# Patient Record
Sex: Female | Born: 1995 | Race: Black or African American | Hispanic: No | Marital: Single | Smoking: Never smoker
Health system: Southern US, Community
[De-identification: ages and names within clinical notes are randomized; demographics above are authoritative.]

---
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
## 2017-09-01 ENCOUNTER — Other Ambulatory Visit: Payer: Self-pay

## 2017-09-01 ENCOUNTER — Emergency Department
Admission: EM | Admit: 2017-09-01 | Discharge: 2017-09-01 | Disposition: A | Discharge status: Home / Self Care | Payer: Self-pay | Attending: Emergency Medicine | Admitting: Emergency Medicine

## 2017-09-01 ENCOUNTER — Encounter: Payer: Self-pay | Admitting: Emergency Medicine

## 2017-09-01 DIAGNOSIS — Z79899 Other long term (current) drug therapy: Secondary | ICD-10-CM | POA: Insufficient documentation

## 2017-09-01 DIAGNOSIS — J020 Streptococcal pharyngitis: Secondary | ICD-10-CM | POA: Insufficient documentation

## 2017-09-01 LAB — GROUP A STREP BY PCR: GROUP A STREP BY PCR: NOT DETECTED

## 2017-09-01 MED ORDER — ACETAMINOPHEN-CODEINE #3 300-30 MG PO TABS: 1.0000 | ORAL_TABLET | Freq: Four times a day (QID) | ORAL | 0 refills | Status: AC | PRN

## 2017-09-01 MED ORDER — DEXAMETHASONE SODIUM PHOSPHATE 10 MG/ML IJ SOLN
10.0000 mg | Freq: Once | INTRAMUSCULAR | Status: AC
  Administered 2017-09-01: 10 mg via INTRAMUSCULAR
  Filled 2017-09-01: qty 1

## 2017-09-01 MED ORDER — PENICILLIN G BENZATHINE 1200000 UNIT/2ML IM SUSP
1.2000 10*6.[IU] | Freq: Once | INTRAMUSCULAR | Status: AC
  Administered 2017-09-01: 1.2 10*6.[IU] via INTRAMUSCULAR
  Filled 2017-09-01: qty 2

## 2017-09-01 MED ORDER — ACETAMINOPHEN 160 MG/5ML PO SOLN
325.0000 mg | Freq: Once | ORAL | Status: AC
  Administered 2017-09-01: 325 mg via ORAL

## 2017-09-01 MED ORDER — HYDROCODONE-ACETAMINOPHEN 7.5-325 MG/15ML PO SOLN
10.0000 mL | Freq: Once | ORAL | Status: AC
  Administered 2017-09-01: 10 mL via ORAL
  Filled 2017-09-01: qty 15

## 2017-09-01 MED ORDER — PREDNISONE 10 MG (21) PO TBPK: ORAL_TABLET | ORAL | 0 refills | Status: DC

## 2017-09-01 MED ORDER — LIDOCAINE VISCOUS 2 % MT SOLN
15.0000 mL | Freq: Once | OROMUCOSAL | Status: AC
Start: 2017-09-01 — End: 2017-09-01
  Administered 2017-09-01: 15 mL via OROMUCOSAL
  Filled 2017-09-01: qty 15

## 2017-09-01 NOTE — ED Notes (Signed)
See triage note  Presents with a 3-5 day hx of sore throat   Unsure of fever at home but febrile on arrival  Increased pain with swallowing

## 2017-09-01 NOTE — ED Triage Notes (Signed)
Arrives c/o sore throat x 3 days.

## 2017-09-01 NOTE — Discharge Instructions (Signed)
You have been treated for strep throat (pharyngitis) based on your clinical exam, despite a negative rapid strep test. Take the pain medicine as needed and the steroid as directed. Change your toothbrush head in 24-hours. Rinse with warm-salty water and drink plenty of fluids (Powerade/Gatorade, etc.) to prevent dehydration. Follow-up with ENT for further management. See [facility] for routine medical care.

## 2017-09-01 NOTE — ED Provider Notes (Signed)
[facility] Emergency Department Provider Note ____________________________________________  Time seen: [id number]  I have reviewed the triage vital signs and the nursing notes.  HISTORY  Chief Complaint  Sore Throat  HPI Laura Yoder is a 22 y.o. female presents to the ED accompanied by family member for evaluation of sore throat for the last 3 days.  Patient describes intermittent sore throat pain for the last week, but sharply worsening pain, swelling, and discomfort with swallowing, over the last 3 days.  Patient herself is unaware of any fevers, but presents to us febrile and tachycardic in triage.  She does note a past medical history of tachycardia.  She denies any nausea, vomiting, rash, or shortness of breath.  She has had decreased oral intake secondary to pain and discomfort.  She does note she was able to drink a smoothie earlier today.  She denies any sick contacts or other exposures.  She does report 2 previous strep infection in the past with her most recent infection being in September of last year.  History reviewed. No pertinent past medical history.  There are no active problems to display for this patient.  History reviewed. No pertinent surgical history.  Prior to Admission medications   Medication Sig Start Date End Date Taking? Authorizing Provider  acetaminophen-codeine (TYLENOL #3) 300-30 MG tablet Take 1 tablet by mouth every 6 (six) hours as needed for moderate pain. 09/01/17   Skylynne Schlechter, Charlesetta IvoryJenise V Bacon, PA-C  desoximetasone (TOPICORT) 0.25 % cream Apply 1 application topically 2 (two) times daily. 04/29/16   Joni ReiningSmith, Ronald K, PA-C  methylPREDNISolone (MEDROL DOSEPAK) 4 MG TBPK tablet Take Tapered dose as directed 04/29/16   Joni ReiningSmith, Ronald K, PA-C  predniSONE (STERAPRED UNI-PAK 21 TAB) 10 MG (21) TBPK tablet 6-day taper as directed. 09/01/17   Towana Stenglein, Charlesetta IvoryJenise V Bacon, PA-C    Allergies Patient has no known allergies.  No family history on  file.  Social History Social History   Tobacco Use  . Smoking status: Never Smoker  . Smokeless tobacco: Never Used  Substance Use Topics  . Alcohol use: No  . Drug use: No    Review of Systems  Constitutional: Negative for fever. Eyes: Negative for visual changes. ENT: Positive for sore throat. Cardiovascular: Negative for chest pain. Respiratory: Negative for shortness of breath. Gastrointestinal: Negative for abdominal pain, vomiting and diarrhea. Skin: Negative for rash. Neurological: Negative for headaches, focal weakness or numbness. ____________________________________________  PHYSICAL EXAM:  VITAL SIGNS: ED Triage Vitals  Enc Vitals Group     BP 09/01/17 1740 (!) 125/91     Pulse Rate 09/01/17 1740 (!) 120     Resp 09/01/17 1740 18     Temp 09/01/17 1740 99.7 F (37.6 C)     Temp Source 09/01/17 1740 Oral     SpO2 09/01/17 1740 99 %     Weight 09/01/17 1718 135 lb (61.2 kg)     Height 09/01/17 1718 5\' 6"  (1.676 m)     Head Circumference --      Peak Flow --      Pain Score 09/01/17 1717 8     Pain Loc --      Pain Edu? --      Excl. in GC? --     Constitutional: Alert and oriented. Well appearing and in no distress. Head: Normocephalic and atraumatic. Eyes: Conjunctivae are normal. PERRL. Normal extraocular movements Ears: Canals clear. TMs intact bilaterally. Mouth/Throat: Mucous membranes are moist. Uvula is midline and tonsils  are enlarged and with exudates R>L. Pain elicited with attempts to open the jaw.  Neck: Supple. No thyromegaly. Hematological/Lymphatic/Immunological: Palpable right anterior cervical lymphadenopathy. Cardiovascular: Normal rate, regular rhythm. Normal distal pulses. Respiratory: Normal respiratory effort. No wheezes/rales/rhonchi. Musculoskeletal: Nontender with normal range of motion in all extremities.  Neurologic:  Normal gait without ataxia. Normal speech and language. No gross focal neurologic deficits are  appreciated. ____________________________________________   LABS (pertinent positives/negatives)  Labs Reviewed  GROUP A STREP BY PCR  ____________________________________________  PROCEDURES  Procedures Viscous Lidocaine 2% - 15 ml PO Decadron 10 mg IM Bicillin LA 1.2 million units IM Hydrocodone-acetaminophen 7.5-325 mg/15 ml - 10 ml PO ____________________________________________  INITIAL IMPRESSION / ASSESSMENT AND PLAN / ED COURSE  Patient presents to the ED with worsening sore throat pain over the last 3 days.  Patient's exam reveals enlarged tonsils bilaterally.  It is my suspicion that the nurse was unable to obtain a sufficient specimen for the throat culture which limits its reliability.  As such, the patient is been treated empirically for an acute strep tonsillitis.  She reports improvement of her symptoms following medication administration in the ED.  Post medication administration, her exam also reveals her ability to smoothly open the jaw and does reveal 2+ tonsils with exudates bilaterally.  Uvula remains midline.  Patient is able to control secretions and has better phonation at this time.  She will be discharged with a prescription for Tylenol #3, as well as a prednisone taper pack.  Return precautions have been reviewed with the patient and her family member.  She will further be referred to ENT for evaluation management as needed.  Work note is provided for tomorrow as requested. ____________________________________________  FINAL CLINICAL IMPRESSION(S) / ED DIAGNOSES  Final diagnoses:  Acute streptococcal pharyngitis      Karmen Stabs, Charlesetta Ivory, PA-C 09/01/17 1943    Arnaldo Natal, MD 09/01/17 2336

## 2018-01-03 DIAGNOSIS — S99911A Unspecified injury of right ankle, initial encounter: Secondary | ICD-10-CM | POA: Diagnosis present

## 2018-01-03 DIAGNOSIS — Y998 Other external cause status: Secondary | ICD-10-CM | POA: Diagnosis not present

## 2018-01-03 DIAGNOSIS — Y9367 Activity, basketball: Secondary | ICD-10-CM | POA: Diagnosis not present

## 2018-01-03 DIAGNOSIS — Y929 Unspecified place or not applicable: Secondary | ICD-10-CM | POA: Insufficient documentation

## 2018-01-03 DIAGNOSIS — W010XXA Fall on same level from slipping, tripping and stumbling without subsequent striking against object, initial encounter: Secondary | ICD-10-CM | POA: Insufficient documentation

## 2018-01-03 DIAGNOSIS — Z79899 Other long term (current) drug therapy: Secondary | ICD-10-CM | POA: Diagnosis not present

## 2018-01-03 DIAGNOSIS — S92351A Displaced fracture of fifth metatarsal bone, right foot, initial encounter for closed fracture: Secondary | ICD-10-CM | POA: Insufficient documentation

## 2018-01-04 ENCOUNTER — Other Ambulatory Visit: Payer: Self-pay

## 2018-01-04 ENCOUNTER — Emergency Department: Payer: Medicaid Other

## 2018-01-04 ENCOUNTER — Emergency Department
Admission: EM | Admit: 2018-01-04 | Discharge: 2018-01-04 | Disposition: A | Discharge status: Home / Self Care | Payer: Medicaid Other | Attending: Emergency Medicine | Admitting: Emergency Medicine

## 2018-01-04 DIAGNOSIS — S92351A Displaced fracture of fifth metatarsal bone, right foot, initial encounter for closed fracture: Secondary | ICD-10-CM

## 2018-01-04 DIAGNOSIS — M25571 Pain in right ankle and joints of right foot: Secondary | ICD-10-CM

## 2018-01-04 MED ORDER — OXYCODONE-ACETAMINOPHEN 5-325 MG PO TABS
1.0000 | ORAL_TABLET | Freq: Once | ORAL | Status: AC
  Administered 2018-01-04: 1 via ORAL
  Filled 2018-01-04: qty 1

## 2018-01-04 MED ORDER — IBUPROFEN 600 MG PO TABS
600.0000 mg | ORAL_TABLET | Freq: Once | ORAL | Status: AC
  Administered 2018-01-04: 600 mg via ORAL
  Filled 2018-01-04: qty 1

## 2018-01-04 MED ORDER — OXYCODONE-ACETAMINOPHEN 5-325 MG PO TABS: 1.0000 | ORAL_TABLET | ORAL | 0 refills | Status: AC | PRN

## 2018-01-04 MED ORDER — IBUPROFEN 600 MG PO TABS: 600.0000 mg | ORAL_TABLET | Freq: Three times a day (TID) | ORAL | 0 refills | Status: AC | PRN

## 2018-01-04 NOTE — Discharge Instructions (Signed)
1.  You may take pain medicines as needed (Motrin/Percocet). 2.  Keep splint clean and dry.  Elevate affected area and apply ice over splint. 3.  Do not put weight on your right foot.  Use crutches to walk. 4.  Return to the ER for worsening symptoms, persistent vomiting, difficulty breathing or other concerns.

## 2018-01-04 NOTE — ED Triage Notes (Signed)
Reports pain to right ankle after playing basketball.

## 2018-01-04 NOTE — ED Provider Notes (Signed)
[facility] Emergency Department Provider Note   ____________________________________________   First MD Initiated Contact with Patient 01/04/18 [phone]     (approximate)  I have reviewed the triage vital signs and the nursing notes.   HISTORY  Chief Complaint Ankle Pain    HPI Laura Yoder is a 22 y.o. female who presents to the ED from home with a chief complaint of right foot/ankle pain and injury.  Patient reports she rolled her right ankle while playing basketball yesterday evening.  Complains of pain and swelling to her right foot/ankle.  Pain is exacerbated by movement.  Denies associated extremity weakness/numbness or tingling.  Denies striking head or LOC.  Denies headache, neck pain, vision changes, chest pain, shortness of breath, abdominal pain, nausea, vomiting.   Past medical history None  There are no active problems to display for this patient.   No past surgical history on file.  Prior to Admission medications   Medication Sig Start Date End Date Taking? Authorizing Provider  acetaminophen-codeine (TYLENOL #3) 300-30 MG tablet Take 1 tablet by mouth every 6 (six) hours as needed for moderate pain. 09/01/17   Menshew, Charlesetta Ivory, PA-C  desoximetasone (TOPICORT) 0.25 % cream Apply 1 application topically 2 (two) times daily. 04/29/16   Joni Reining, PA-C  ibuprofen (ADVIL,MOTRIN) 600 MG tablet Take 1 tablet (600 mg total) by mouth every 8 (eight) hours as needed. 01/04/18   Irean Hong, MD  methylPREDNISolone (MEDROL DOSEPAK) 4 MG TBPK tablet Take Tapered dose as directed 04/29/16   Joni Reining, PA-C  oxyCODONE-acetaminophen (PERCOCET/ROXICET) 5-325 MG tablet Take 1 tablet by mouth every 4 (four) hours as needed for severe pain. 01/04/18   Irean Hong, MD  predniSONE (STERAPRED UNI-PAK 21 TAB) 10 MG (21) TBPK tablet 6-day taper as directed. 09/01/17   Menshew, Charlesetta Ivory, PA-C    Allergies Patient has no known allergies.  No  family history on file.  Social History Social History   Tobacco Use  . Smoking status: Never Smoker  . Smokeless tobacco: Never Used  Substance Use Topics  . Alcohol use: No  . Drug use: No    Review of Systems  Constitutional: No fever/chills. Eyes: No visual changes. ENT: No sore throat. Cardiovascular: Denies chest pain. Respiratory: Denies shortness of breath. Gastrointestinal: No abdominal pain.  No nausea, no vomiting.  No diarrhea.  No constipation. Genitourinary: Negative for dysuria. Musculoskeletal: Positive for right ankle pain and injury.  Negative for back pain. Skin: Negative for rash. Neurological: Negative for headaches, focal weakness or numbness.   ____________________________________________   PHYSICAL EXAM:  VITAL SIGNS: ED Triage Vitals  Enc Vitals Group     BP 01/04/18 0001 116/74     Pulse Rate 01/04/18 0001 (!) 117     Resp 01/04/18 0001 18     Temp 01/04/18 0001 99.3 F (37.4 C)     Temp Source 01/04/18 0001 Oral     SpO2 01/04/18 0001 100 %     Weight 01/04/18 0004 135 lb (61.2 kg)     Height 01/04/18 0004  (1.575 m)     Head Circumference --      Peak Flow --      Pain Score 01/04/18 0002 5     Pain Loc --      Pain Edu? --      Excl. in GC? --     Constitutional: Alert and oriented. Well appearing and in no acute  distress. Eyes: Conjunctivae are normal. PERRL. EOMI. Head: Atraumatic. Nose: No congestion/rhinnorhea. Mouth/Throat: Mucous membranes are moist.  Oropharynx non-erythematous. Neck: No stridor.  No cervical spine tenderness to palpation. Cardiovascular: Normal rate, regular rhythm. Grossly normal heart sounds.  Good peripheral circulation. Respiratory: Normal respiratory effort.  No retractions. Lungs CTAB. Gastrointestinal: Soft and nontender. No distention. No abdominal bruits. No CVA tenderness. Musculoskeletal:  RLE: Mild swelling to base of right metatarsal.  Tender to palpation.  Limited range of motion  secondary to pain.  2+ distal pulses.  Brisk, less than 5-second capillary refill. Neurologic:  Normal speech and language. No gross focal neurologic deficits are appreciated.  Skin:  Skin is warm, dry and intact. No rash noted. Psychiatric: Mood and affect are normal. Speech and behavior are normal.  ____________________________________________   LABS (all labs ordered are listed, but only abnormal results are displayed)  Labs Reviewed - No data to display ____________________________________________  EKG  None ____________________________________________  RADIOLOGY  ED MD interpretation: Right fifth metatarsal fracture, displaced  Official radiology report(s): Dg Ankle Complete Right  Result Date: 01/04/2018 CLINICAL DATA:  Acute onset of lateral right ankle pain. Initial encounter. EXAM: RIGHT ANKLE - COMPLETE 3+ VIEW COMPARISON:  None. FINDINGS: There is a displaced avulsion fracture at the base of the fifth metatarsal, with overlying soft tissue swelling. The ankle mortise is intact; the interosseous space is within normal limits. No talar tilt or subluxation is seen. The fracture extends to the tarsometatarsal joint. Visualized joint spaces are otherwise preserved. No significant soft tissue abnormalities are seen. IMPRESSION: Displaced avulsion fracture at the base of the fifth metatarsal, with overlying soft tissue swelling. Electronically Signed   By: Roanna Raider M.D.   On: 01/04/2018 00:55   Dg Foot Complete Right  Result Date: 01/04/2018 CLINICAL DATA:  Acute onset of lateral right foot pain after landing wrong on right foot. Initial encounter. EXAM: RIGHT FOOT COMPLETE - 3+ VIEW COMPARISON:  None. FINDINGS: There is a displaced avulsion fracture at the base of the fifth metatarsal, with overlying soft tissue swelling. This extends to the tarsometatarsal joint. The joint spaces are otherwise grossly preserved. There is no evidence of talar subluxation; the subtalar joint is  unremarkable in appearance. IMPRESSION: Displaced avulsion fracture at the base of the fifth metatarsal, with overlying soft tissue swelling. Electronically Signed   By: Roanna Raider M.D.   On: 01/04/2018 00:56    ____________________________________________   PROCEDURES  Procedure(s) performed: None   Procedures  Critical Care performed: No  ____________________________________________   INITIAL IMPRESSION / ASSESSMENT AND PLAN / ED COURSE  As part of my medical decision making, I reviewed the following data within the electronic MEDICAL RECORD NUMBER Nursing notes reviewed and incorporated, Radiograph reviewed and Notes from prior ED visits   22 year old female who presents status post right ankle injury after playing basketball.  X-rays demonstrate right fifth metatarsal fracture extending into the tarsometatarsal joint.  Will place in posterior OCL splint, make nonweightbearing, crutches, Motrin and Percocet for pain as needed, and follow-up with podiatry next week.  Strict return precautions given.  Patient and friend verbalize understanding and agree with plan of care.      ____________________________________________   FINAL CLINICAL IMPRESSION(S) / ED DIAGNOSES  Final diagnoses:  Acute right ankle pain  Closed displaced fracture of fifth metatarsal bone of right foot, initial encounter     ED Discharge Orders        Ordered    ibuprofen (ADVIL,MOTRIN) 600 MG tablet  Every 8 hours PRN     01/04/18 0502    oxyCODONE-acetaminophen (PERCOCET/ROXICET) 5-325 MG tablet  Every 4 hours PRN     01/04/18 0502       Note:  This document was prepared using Dragon voice recognition software and may include unintentional dictation errors.    Irean Hong, MD 01/04/18 [phone]

## 2018-03-26 ENCOUNTER — Emergency Department: Payer: Medicaid Other

## 2018-03-26 ENCOUNTER — Encounter: Payer: Self-pay | Admitting: Emergency Medicine

## 2018-03-26 ENCOUNTER — Emergency Department
Admission: EM | Admit: 2018-03-26 | Discharge: 2018-03-26 | Disposition: A | Discharge status: Home / Self Care | Payer: Medicaid Other | Attending: Emergency Medicine | Admitting: Emergency Medicine

## 2018-03-26 ENCOUNTER — Other Ambulatory Visit: Payer: Self-pay

## 2018-03-26 DIAGNOSIS — J029 Acute pharyngitis, unspecified: Secondary | ICD-10-CM | POA: Diagnosis not present

## 2018-03-26 LAB — CBC WITH DIFFERENTIAL/PLATELET
Basophils Absolute: 0.1 10*3/uL (ref 0–0.1)
Basophils Relative: 1 %
Eosinophils Absolute: 0.1 10*3/uL (ref 0–0.7)
Eosinophils Relative: 1 %
HCT: 39.5 % (ref 35.0–47.0)
Hemoglobin: 13.5 g/dL (ref 12.0–16.0)
Lymphocytes Relative: 10 %
Lymphs Abs: 1.5 10*3/uL (ref 1.0–3.6)
MCH: 28 pg (ref 26.0–34.0)
MCHC: 34.3 g/dL (ref 32.0–36.0)
MCV: 81.8 fL (ref 80.0–100.0)
Monocytes Absolute: 0.9 10*3/uL (ref 0.2–0.9)
Monocytes Relative: 6 %
Neutro Abs: 12.1 10*3/uL — ABNORMAL HIGH (ref 1.4–6.5)
Neutrophils Relative %: 82 %
Platelets: 341 10*3/uL (ref 150–440)
RBC: 4.83 MIL/uL (ref 3.80–5.20)
RDW: 15 % — ABNORMAL HIGH (ref 11.5–14.5)
WBC: 14.7 10*3/uL — ABNORMAL HIGH (ref 3.6–11.0)

## 2018-03-26 LAB — COMPREHENSIVE METABOLIC PANEL
ALT: 8 U/L (ref 0–44)
AST: 19 U/L (ref 15–41)
Albumin: 4.8 g/dL (ref 3.5–5.0)
Alkaline Phosphatase: 68 U/L (ref 38–126)
Anion gap: 10 (ref 5–15)
BUN: 9 mg/dL (ref 6–20)
CO2: 25 mmol/L (ref 22–32)
Calcium: 9.3 mg/dL (ref 8.9–10.3)
Chloride: 106 mmol/L (ref 98–111)
Creatinine, Ser: 0.78 mg/dL (ref 0.44–1.00)
GFR calc Af Amer: >60 mL/min (ref >60)
GFR calc non Af Amer: >60 mL/min (ref >60)
Glucose, Bld: 84 mg/dL (ref 70–99)
Potassium: 4.2 mmol/L (ref 3.5–5.1)
Sodium: 141 mmol/L (ref 135–145)
Total Bilirubin: 0.8 mg/dL (ref 0.3–1.2)
Total Protein: 8.5 g/dL — ABNORMAL HIGH (ref 6.5–8.1)

## 2018-03-26 LAB — GROUP A STREP BY PCR: Group A Strep by PCR: NOT DETECTED

## 2018-03-26 LAB — MONONUCLEOSIS SCREEN: Mono Screen: NEGATIVE

## 2018-03-26 MED ORDER — AMOXICILLIN 875 MG PO TABS: 875.0000 mg | ORAL_TABLET | Freq: Two times a day (BID) | ORAL | 0 refills | Status: AC

## 2018-03-26 MED ORDER — PREDNISONE 10 MG (21) PO TBPK: ORAL_TABLET | ORAL | 0 refills | Status: AC

## 2018-03-26 MED ORDER — DEXAMETHASONE SODIUM PHOSPHATE 10 MG/ML IJ SOLN
10.0000 mg | Freq: Once | INTRAMUSCULAR | Status: AC
  Administered 2018-03-26: 10 mg via INTRAVENOUS
  Filled 2018-03-26: qty 1

## 2018-03-26 MED ORDER — AMOXICILLIN-POT CLAVULANATE 875-125 MG PO TABS: 1.0000 | ORAL_TABLET | Freq: Two times a day (BID) | ORAL | 0 refills | Status: AC

## 2018-03-26 MED ORDER — SODIUM CHLORIDE 0.9 % IV BOLUS
1000.0000 mL | Freq: Once | INTRAVENOUS | Status: AC
  Administered 2018-03-26: 1000 mL via INTRAVENOUS

## 2018-03-26 MED ORDER — IOPAMIDOL (ISOVUE-370) INJECTION 76%
60.0000 mL | Freq: Once | INTRAVENOUS | Status: AC | PRN
  Administered 2018-03-26: 60 mL via INTRAVENOUS
  Filled 2018-03-26: qty 75

## 2018-03-26 MED ORDER — SODIUM CHLORIDE 0.9 % IV SOLN
3.0000 g | Freq: Once | INTRAVENOUS | Status: AC
  Administered 2018-03-26: 3 g via INTRAVENOUS
  Filled 2018-03-26: qty 3

## 2018-03-26 NOTE — ED Notes (Signed)
Patient transported to X-ray 

## 2018-03-26 NOTE — ED Triage Notes (Signed)
Presents with swollen tonsils and sore throat  Hx of same

## 2018-03-26 NOTE — ED Notes (Addendum)
Presents for repeat swollen tonsils   States sx's started couple of days ago  Hx of same with peri tonsillar abscess in past  Waiting for ENT appt  Voice is muffled

## 2018-03-26 NOTE — ED Provider Notes (Signed)
[facility] Emergency Department Provider Note  ____________________________________________  Time seen: Approximately 3:48 PM  I have reviewed the triage vital signs and the nursing notes.   HISTORY  Chief Complaint Sore Throat    HPI Laura Yoder is a 22 y.o. female  with a history of peritonsillar abscesses presents to the emergency department with pharyngitis, fever and chills for approximately 1 week.  Patient was last diagnosed with bilateral peritonsillar abscesses on 02/08/2018 and underwent incision and drainage of both tonsils.  Patient has not followed up with otolaryngology due to a lack of insurance.  Patient has noticed a "hot potato" voice for approximately 4 to 5 days.  Patient has not been able to eat or drink over the past 2 days.  Patient is able to manage her own secretions.  She denies shortness of breath.  No alleviating measures of been attempted.   History reviewed. No pertinent past medical history.  There are no active problems to display for this patient.   History reviewed. No pertinent surgical history.  Prior to Admission medications   Medication Sig Start Date End Date Taking? Authorizing Provider  acetaminophen-codeine (TYLENOL #3) 300-30 MG tablet Take 1 tablet by mouth every 6 (six) hours as needed for moderate pain. 09/01/17   Menshew, Charlesetta Ivory, PA-C  amoxicillin (AMOXIL) 875 MG tablet Take 1 tablet (875 mg total) by mouth 2 (two) times daily for 10 days. 03/26/18 04/05/18  Orvil Feil, PA-C  amoxicillin-clavulanate (AUGMENTIN) 875-125 MG tablet Take 1 tablet by mouth 2 (two) times daily for 10 days. 03/26/18 04/05/18  Orvil Feil, PA-C  desoximetasone (TOPICORT) 0.25 % cream Apply 1 application topically 2 (two) times daily. 04/29/16   Joni Reining, PA-C  ibuprofen (ADVIL,MOTRIN) 600 MG tablet Take 1 tablet (600 mg total) by mouth every 8 (eight) hours as needed. 01/04/18   Irean Hong, MD  methylPREDNISolone (MEDROL  DOSEPAK) 4 MG TBPK tablet Take Tapered dose as directed 04/29/16   Joni Reining, PA-C  oxyCODONE-acetaminophen (PERCOCET/ROXICET) 5-325 MG tablet Take 1 tablet by mouth every 4 (four) hours as needed for severe pain. 01/04/18   Irean Hong, MD  predniSONE (STERAPRED UNI-PAK 21 TAB) 10 MG (21) TBPK tablet Take 6 tablets the first day, take 5 tablets the second day, take 4 tablets the third day, take 3 tablets the fourth day, take 2 tablets the fifth day, take 1 tablet the sixth day. 03/26/18   Orvil Feil, PA-C    Allergies Patient has no known allergies.  No family history on file.  Social History Social History   Tobacco Use  . Smoking status: Never Smoker  . Smokeless tobacco: Never Used  Substance Use Topics  . Alcohol use: No  . Drug use: No     Review of Systems  Constitutional: Patient has fever.  Eyes: No visual changes. No discharge ENT: Patient has pharyngitis  Cardiovascular: no chest pain. Respiratory: no cough. No SOB. Gastrointestinal: No abdominal pain.  No nausea, no vomiting.  No diarrhea.  No constipation. Genitourinary: Negative for dysuria. No hematuria Musculoskeletal: Negative for musculoskeletal pain. Skin: Negative for rash, abrasions, lacerations, ecchymosis. Neurological: Negative for headaches, focal weakness or numbness.   ____________________________________________   PHYSICAL EXAM:  VITAL SIGNS: ED Triage Vitals [03/26/18 1517]  Enc Vitals Group     BP      Pulse      Resp      Temp      Temp  src      SpO2      Weight 130 lb (59 kg)     Height 5\' 2"  (1.575 m)     Head Circumference      Peak Flow      Pain Score 6     Pain Loc      Pain Edu?      Excl. in GC?      Constitutional: Alert and oriented. Well appearing and in no acute distress. Eyes: Conjunctivae are normal. PERRL. EOMI. Head: Atraumatic. ENT:      Ears: TMs are pearly.      Nose: No congestion/rhinnorhea.      Mouth/Throat: Mucous membranes are moist.   Posterior pharynx is erythematous with bilateral tonsillar exudate and hypertrophy.  Left tonsil appears larger than right.  Uvula is midline. Hematological/Lymphatic/Immunilogical: Palpable cervical lymphadenopathy. Cardiovascular: Normal rate, regular rhythm. Normal S1 and S2.  Good peripheral circulation. Respiratory: Normal respiratory effort without tachypnea or retractions. Lungs CTAB. Good air entry to the bases with no decreased or absent breath sounds. Musculoskeletal: Full range of motion to all extremities. No gross deformities appreciated. Neurologic:  Normal speech and language. No gross focal neurologic deficits are appreciated.  Skin:  Skin is warm, dry and intact. No rash noted.   ___________________________________ LABS (all labs ordered are listed, but only abnormal results are displayed)  Labs Reviewed  CBC WITH DIFFERENTIAL/PLATELET - Abnormal; Notable for the following components:      Result Value   WBC 14.7 (*)    RDW 15.0 (*)    Neutro Abs 12.1 (*)    All other components within normal limits  COMPREHENSIVE METABOLIC PANEL - Abnormal; Notable for the following components:   Total Protein 8.5 (*)    All other components within normal limits  GROUP A STREP BY PCR  MONONUCLEOSIS SCREEN   ____________________________________________  EKG   ____________________________________________  RADIOLOGY I personally viewed and evaluated these images as part of my medical decision making, as well as reviewing the written report by the radiologist.    Ct Soft Tissue Neck W Contrast  Result Date: 03/26/2018 CLINICAL DATA:  22 y/o F; swollen tonsils with history of tonsillar abscess. EXAM: CT NECK WITH CONTRAST TECHNIQUE: Multidetector CT imaging of the neck was performed using the standard protocol following the bolus administration of intravenous contrast. CONTRAST:  60mL ISOVUE-370 IOPAMIDOL (ISOVUE-370) INJECTION 76% COMPARISON:  None. FINDINGS: Pharynx and larynx:  Diffuse enlargement of the palatine tonsils and extensive mucosal thickening within the left-greater-than-right oropharynx. Left palatine tonsil abscess measuring 11 x 16 x 16 mm (AP x ML x CC series 6, image 44 and series 7, image 42). Mild inflammation within the left-sided parapharyngeal fat and anterior cervical compartment. No findings of epiglottitis or laryngitis. Salivary glands: No inflammation, mass, or stone. Thyroid: Normal. Lymph nodes: Bilateral upper cervical lymphadenopathy, likely reactive. No lymph node necrosis. Vascular: Negative. Limited intracranial: Negative. Visualized orbits: Negative. Mastoids and visualized paranasal sinuses: Clear. Skeleton: No acute or aggressive process. Upper chest: Negative. Other: None. IMPRESSION: Acute tonsillitis and left-sided oral pharyngitis. Left palatine tonsil abscess measuring up to 16 mm. Mild inflammation within adjacent left-sided parapharyngeal fat and anterior cervical compartment.Upper cervical reactive lymphadenopathy. Electronically Signed   By: Mitzi Hansen M.D.   On: 03/26/2018 18:01    ____________________________________________    PROCEDURES  Procedure(s) performed:    Procedures    Medications  sodium chloride 0.9 % bolus 1,000 mL (0 mLs Intravenous Stopped 03/26/18 1849)  Ampicillin-Sulbactam (  UNASYN) 3 g in sodium chloride 0.9 % 100 mL IVPB (0 g Intravenous Stopped 03/26/18 1849)  dexamethasone (DECADRON) injection 10 mg (10 mg Intravenous Given 03/26/18 1715)  iopamidol (ISOVUE-370) 76 % injection 60 mL (60 mLs Intravenous Contrast Given 03/26/18 1657)     ____________________________________________   INITIAL IMPRESSION / ASSESSMENT AND PLAN / ED COURSE  Pertinent labs & imaging results that were available during my care of the patient were reviewed by me and considered in my medical decision making (see chart for details).  Review of the [place] CSRS was performed in accordance of the [facility] prior to dispensing  any controlled drugs.      Assessment and Plan:  Strep pharyngitis Patient presents to the emergency department with pharyngitis, headache, chills and changes in voice for approximately 1 week.  Due to patient's prior history of peritonsillar abscesses, CT soft tissue neck was obtained which revealed findings consistent with tonsillitis.  Patient was given Unasyn and Decadron in the emergency department.  She was discharged with Augmentin, amoxicillin and tapered prednisone.  She was advised to follow-up with otolaryngology, Dr. Andee PolesVaught.  Vital signs are reassuring prior to discharge.  All patient questions were answered.    ____________________________________________  FINAL CLINICAL IMPRESSION(S) / ED DIAGNOSES  Final diagnoses:  Pharyngitis, unspecified etiology      NEW MEDICATIONS STARTED DURING THIS VISIT:  ED Discharge Orders         Ordered    amoxicillin-clavulanate (AUGMENTIN) 875-125 MG tablet  2 times daily     03/26/18 1838    amoxicillin (AMOXIL) 875 MG tablet  2 times daily     03/26/18 1842    predniSONE (STERAPRED UNI-PAK 21 TAB) 10 MG (21) TBPK tablet     03/26/18 1842              This chart was dictated using voice recognition software/Dragon. Despite best efforts to proofread, errors can occur which can change the meaning. Any change was purely unintentional.    Orvil FeilWoods, Jaclyn M, PA-C 03/26/18 2045    Minna AntisPaduchowski, Kevin, MD 03/27/18 [phone]

## 2018-09-12 ENCOUNTER — Encounter: Payer: Self-pay | Admitting: Emergency Medicine

## 2018-09-12 ENCOUNTER — Emergency Department
Admission: EM | Admit: 2018-09-12 | Discharge: 2018-09-12 | Disposition: A | Discharge status: Home / Self Care | Payer: Medicaid Other | Attending: Emergency Medicine | Admitting: Emergency Medicine

## 2018-09-12 ENCOUNTER — Other Ambulatory Visit: Payer: Self-pay

## 2018-09-12 ENCOUNTER — Emergency Department: Payer: Medicaid Other

## 2018-09-12 DIAGNOSIS — M25521 Pain in right elbow: Secondary | ICD-10-CM | POA: Insufficient documentation

## 2018-09-12 MED ORDER — MELOXICAM 15 MG PO TABS: 15.0000 mg | ORAL_TABLET | Freq: Every day | ORAL | 0 refills | Status: AC

## 2018-09-12 NOTE — ED Triage Notes (Signed)
Pt arrives with concerns over pain to right elbow. No known injury. Pain worse with movement.
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
## 2019-08-12 IMAGING — CT CT NECK W/ CM
4 of 5 series · 14 of 33 positions shown, 16 images · IV contrast (iopamidol)
Comparison: None.

CLINICAL DATA: 22 y/o F; swollen tonsils with history of tonsillar
abscess.

EXAM:
CT NECK WITH CONTRAST
TECHNIQUE: Multidetector CT imaging of the neck was performed using the
standard protocol following the bolus administration of intravenous
contrast.
CONTRAST:  60mL [id number]-[id number] IOPAMIDOL ([id number]) INJECTION 76%

[Series 2: axial neck · axial · 0.48mm/px · z∈[+743,+881]mm · 4 of 116 slices shown, 5 images]
[im 24/116  soft-tissue]
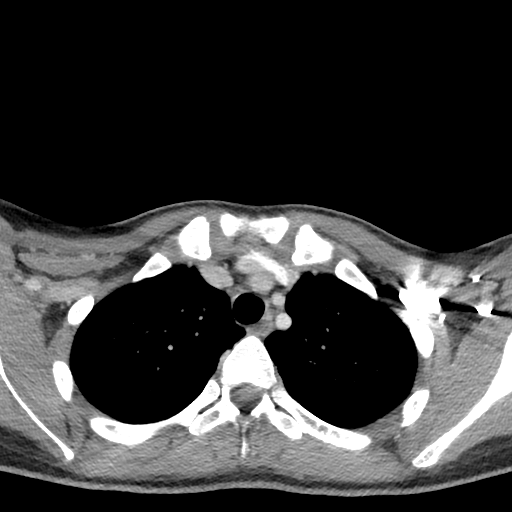
[im 24/116  bone]
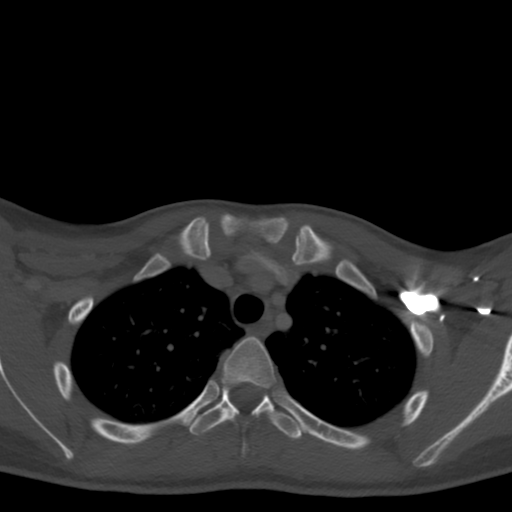
[im 47/116  bone]
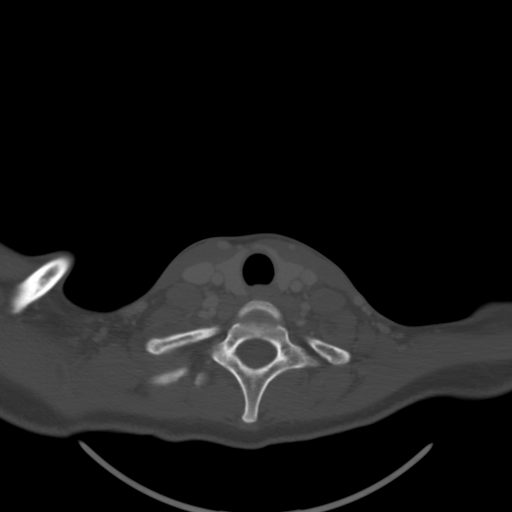
[im 70/116  bone]
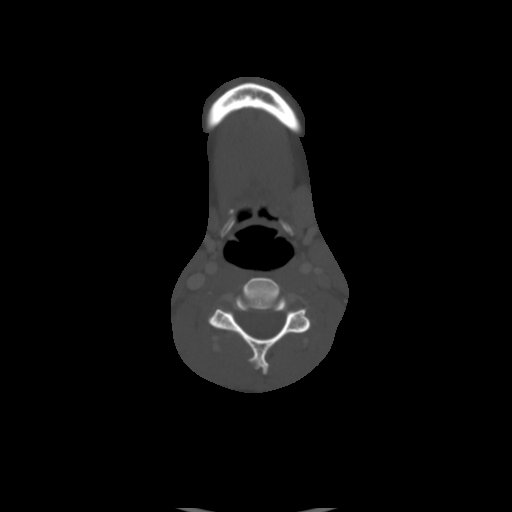
[im 93/116  bone]
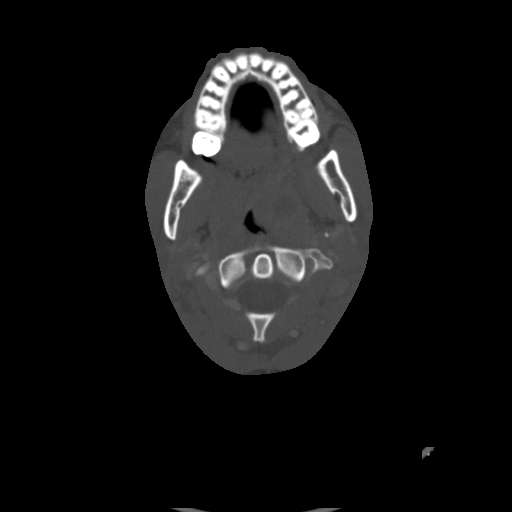

[Series 6: sag neck · sagittal · 0.39mm/px · 5 of 66 slices shown, 6 images]
[im 22/66  bone]
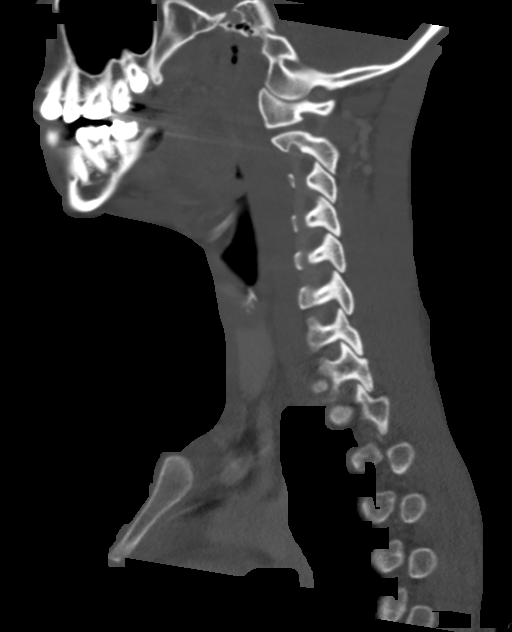
[im 28/66  bone]
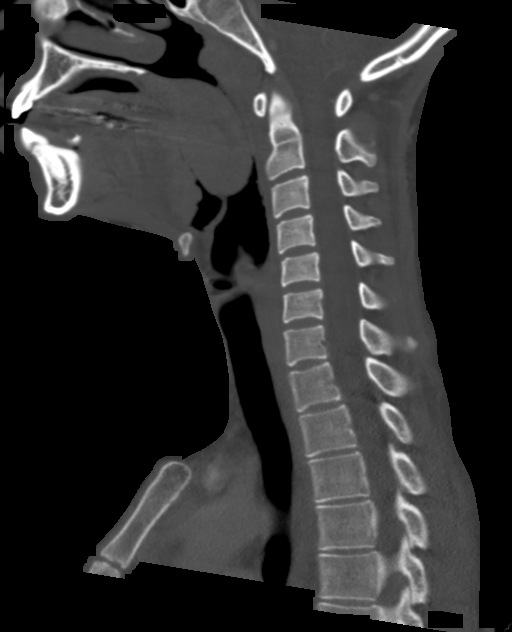
[im 33/66  soft-tissue]
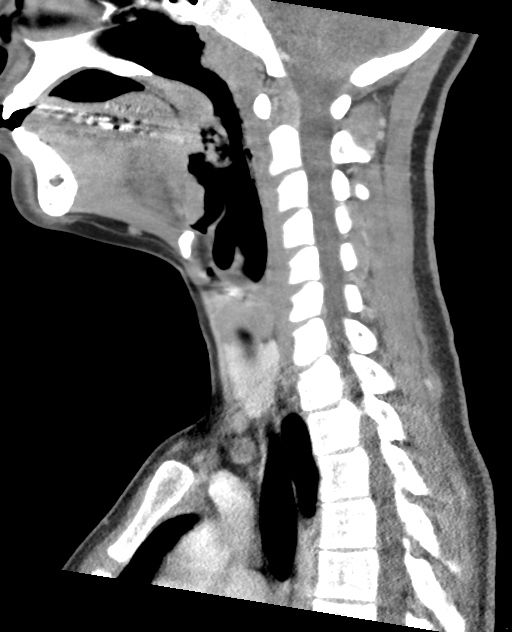
[im 33/66  bone]
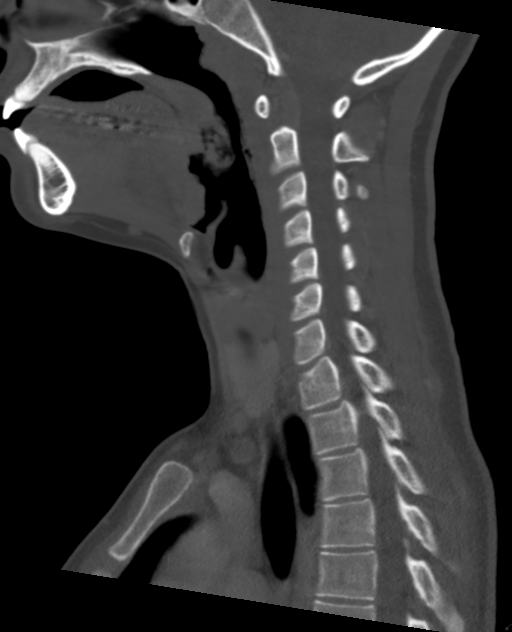
[im 38/66  bone]
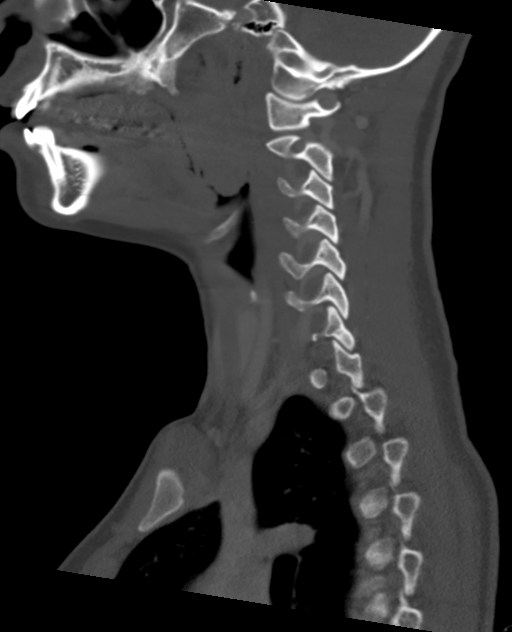
[im 44/66  bone]
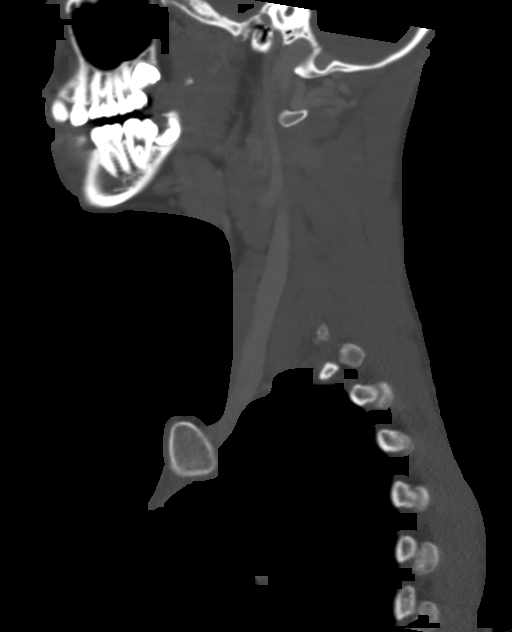

[Series 7: cor neck · coronal · 0.32mm/px · 3 of 89 slices shown]
[im 18/89  bone]
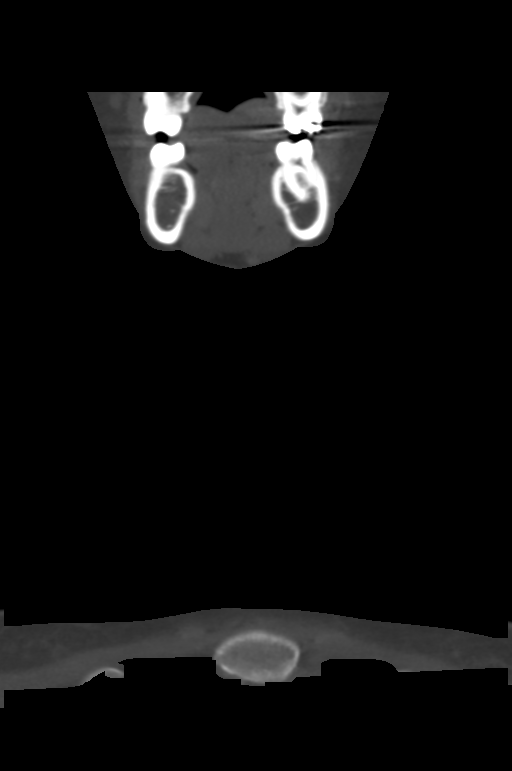
[im 36/89  bone]
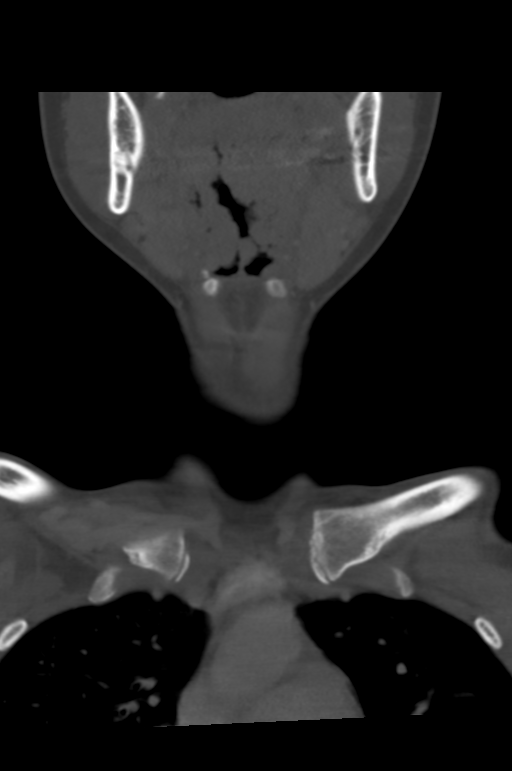
[im 53/89  bone]
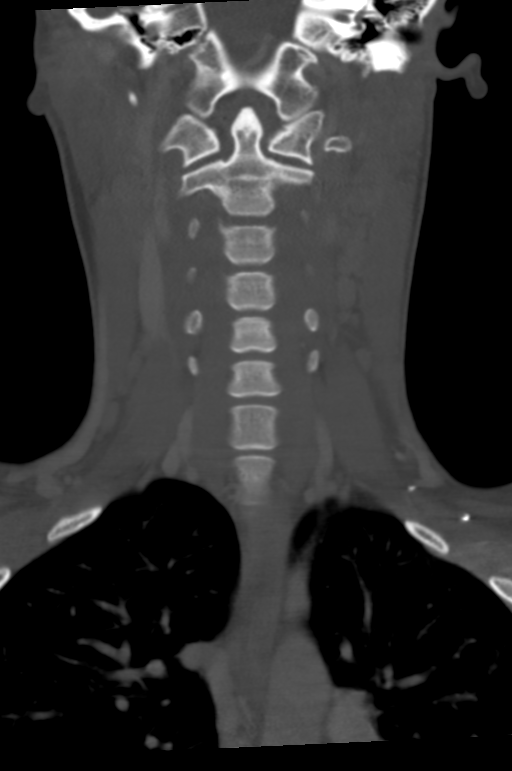

[Series 8: orthogonal ax · axial · 0.30mm/px · z∈[+712,+759]mm · 2 of 123 slices shown]
[im 25/123  bone]
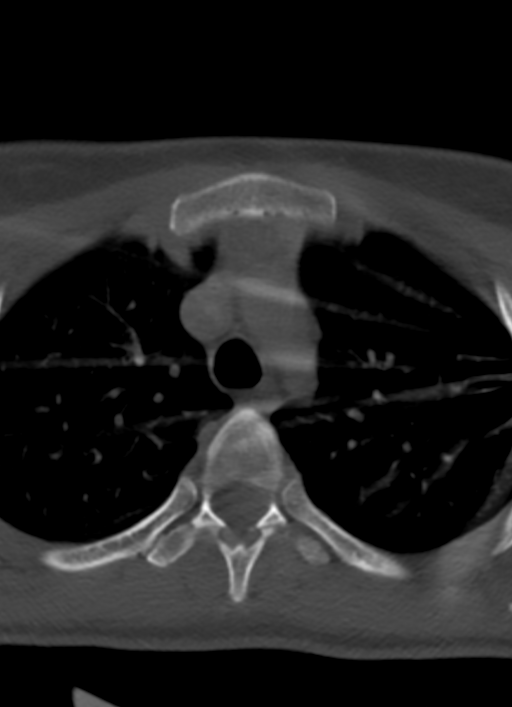
[im 49/123  bone]
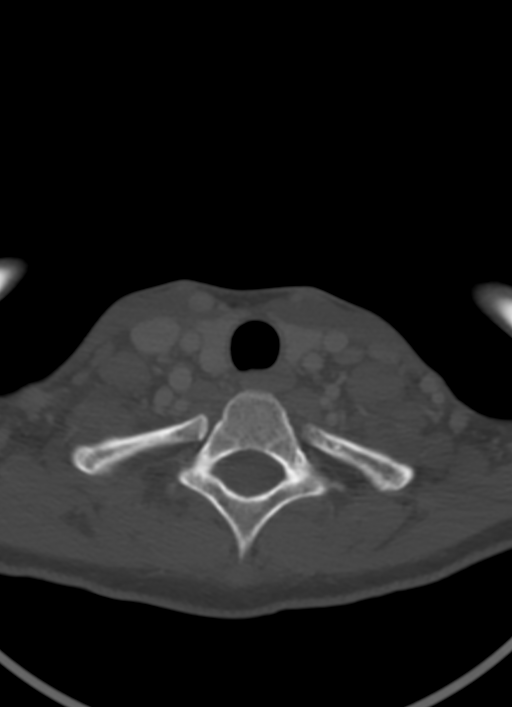

[14 of 33 positions shown; findings below may reference images not displayed]

FINDINGS: Pharynx and larynx: Diffuse enlargement of the palatine tonsils and
extensive mucosal thickening within the left-greater-than-right
oropharynx. Left palatine tonsil abscess measuring 11 x 16 x 16 mm
(AP x ML x CC series 6, image 44 and series 7, image 42). Mild
inflammation within the left-sided parapharyngeal fat and anterior
cervical compartment. No findings of epiglottitis or laryngitis.

Salivary glands: No inflammation, mass, or stone.

Thyroid: Normal.

Lymph nodes: Bilateral upper cervical lymphadenopathy, likely
reactive. No lymph node necrosis.

Vascular: Negative.

Limited intracranial: Negative.

Visualized orbits: Negative.

Mastoids and visualized paranasal sinuses: Clear.

Skeleton: No acute or aggressive process.

Upper chest: Negative.

Other: None.
IMPRESSION: Acute tonsillitis and left-sided oral pharyngitis. Left palatine
tonsil abscess measuring up to 16 mm. Mild inflammation within
adjacent left-sided parapharyngeal fat and anterior cervical
compartment.Upper cervical reactive lymphadenopathy.

By: Ii Yamoto M.D.
